# Patient Record
Sex: Female | Born: 1985 | Race: Black or African American | Hispanic: No | Marital: Single
Health system: Midwestern US, Community
[De-identification: ages and names within clinical notes are randomized; demographics above are authoritative.]

## PROBLEM LIST (undated history)

## (undated) MED ORDER — NAPROXEN 250 MG TAB
250 mg | ORAL_TABLET | Freq: Two times a day (BID) | ORAL | Status: DC
Start: ? — End: 2014-02-27

---
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
## 2011-11-16 MED ORDER — TRIMETHOPRIM-SULFAMETHOXAZOLE 160 MG-800 MG TAB
160-800 mg | ORAL_TABLET | Freq: Two times a day (BID) | ORAL | Status: DC
Start: 2011-11-16 — End: 2011-12-16

## 2011-11-16 MED ORDER — ONDANSETRON HCL 4 MG TAB
4 mg | ORAL_TABLET | Freq: Three times a day (TID) | ORAL | Status: DC | PRN
Start: 2011-11-16 — End: 2011-12-16

## 2011-11-16 MED ORDER — LIDOCAINE-EPINEPHRINE 1 %-1:100,000 IJ SOLN
1 %-:00,000 | Freq: Once | INTRAMUSCULAR | Status: AC
Start: 2011-11-16 — End: 2011-11-16
  Administered 2011-11-16: 19:00:00 via SUBCUTANEOUS

## 2011-11-16 MED ORDER — HYDROCODONE-ACETAMINOPHEN 5 MG-325 MG TAB
5-325 mg | ORAL_TABLET | ORAL | Status: DC | PRN
Start: 2011-11-16 — End: 2011-12-16

## 2011-11-16 NOTE — ED Notes (Signed)
Patient (s) was given copy of dc instructions and 3 script(s).  Patient (s)  verbalized understanding of instructions and script (s).  Patient given a current medication reconciliation form and verbalized understanding of their medications.   Patient (s) verbalized understanding of the importance of discussing medications with  his or her physician or clinic they will be following up with.  Patient alert and oriented and in no acute distress.  Patient discharged home ambulatory with self.

## 2011-11-16 NOTE — ED Notes (Signed)
Purulent, thick, bloody drainage noted during I & D. Area packed and covered with dry, sterile dressing by provider.

## 2011-11-16 NOTE — ED Provider Notes (Signed)
HPI Comments: Brandi Lucas is a 26 y.o. female who presents ambulatory to [facility] ED with cc of 10/10, painful large abscess to buttock x 1 week. Pt states that it hurts her to sit down. Pt notes that she has had abscesses in the past. Pt specifically denies F/C.    PCP: No primary provider on file.    PMhx is significant for: Abscess  SMhx is significant for: Pt denies any pertinent surgical hx  Social hx: + Smoke (1 ppd), + EtOH, - Drugs    There are no other complaints, changes or physical findings at this time.  Written by Melchor Amour, ED Scribe, as dictated by Marlan Palau, DO      The history is provided by the patient.        History reviewed. No pertinent past medical history.     History reviewed. No pertinent past surgical history.      History reviewed. No pertinent family history.     History     Social History   ??? Marital Status: N/A     Spouse Name: N/A     Number of Children: N/A   ??? Years of Education: N/A     Occupational History   ??? Not on file.     Social History Main Topics   ??? Smoking status: Current Everyday Smoker   ??? Smokeless tobacco: Not on file   ??? Alcohol Use: Yes   ??? Drug Use: No   ??? Sexually Active:      Other Topics Concern   ??? Not on file     Social History Narrative   ??? No narrative on file                  ALLERGIES: Review of patient's allergies indicates no known allergies.      Review of Systems   Constitutional: Negative.  Negative for fever and chills.   HENT: Negative.    Eyes: Negative.    Respiratory: Negative.    Cardiovascular: Negative.    Gastrointestinal: Negative.    Genitourinary: Negative.    Musculoskeletal: Negative.    Skin: Positive for wound (Abscess to buttocks).   Neurological: Negative.    All other systems reviewed and are negative.        Filed Vitals:    11/16/11 1455   BP: 125/70   Pulse: 110   Temp: 99.8 ??F (37.7 ??C)   Resp: 16   Height: 5\' 5"  (1.651 m)   Weight: 94.348 kg (208 lb)   SpO2: 99%            Physical Exam   Physical Examination: General  appearance - Well appearing in no apparent distress  Eyes - extraocular eye movements intact  Mouth - No apparent external lesions  Neck -  normal range of motion  Chest - Normal respiratory effort  Neurological - alert, oriented, normal speech, no apparent focal motor findings  Extremities - moves all extremities, no pedal edema  Skin - normal coloration, no apparent rashes, large area  Written by Melchor Amour, ED Scribe, as dictated by Marlan Palau, DO.    MDM     Differential Diagnosis; Clinical Impression; Plan:     DDx: Abscess vs Cellulitis. I&D procedure. Reassess post procedure.      Progress:   Patient progress:  Stable      Procedures    Procedure Note - Incision and Drainage:   3:31 PM  Performed  by: Marlan Palau, DO  Complexity: Simple  Skin prepped with Chlorprep.  Sterile field established.  Anesthesia achieved with 2 mLs of Lidocaine 1% with epinephrine using a local infiltration.   Abscess to buttocks was incised with # 11 blade, and copious of purulent foul smelling discharge was expressed.  Area was packed using 0.25 inch iodoform gauze.  Sterile dressing applied.    The procedure took 1-15 minutes, and pt tolerated well.  Written by Melchor Amour, ED Scribe, as dictated by Marlan Palau, DO.

## 2011-11-16 NOTE — ED Notes (Signed)
Dr. Izola Price at bedside to perform I & D. Nurse at bedside as well.

## 2011-11-22 NOTE — ED Provider Notes (Signed)
I have discussed this patient's care with the mid-level provider and I agree with the care provided and follow-up.

## 2011-11-22 NOTE — ED Provider Notes (Signed)
Patient is a 26 y.o. female presenting with abscess. The history is provided by the patient.   Abscess   This is a new problem. The problem has been gradually improving. There has been no fever. Affected Location: medial aspect of gluteal cleft. The patient is experiencing no pain. She has tried nothing (Pt can not afford antibiotics) for the symptoms. The treatment provided moderate relief.        History reviewed. No pertinent past medical history.     History reviewed. No pertinent past surgical history.      History reviewed. No pertinent family history.     History     Social History   ??? Marital Status: SINGLE     Spouse Name: N/A     Number of Children: N/A   ??? Years of Education: N/A     Occupational History   ??? Not on file.     Social History Main Topics   ??? Smoking status: Current Everyday Smoker   ??? Smokeless tobacco: Not on file   ??? Alcohol Use: Yes   ??? Drug Use: No   ??? Sexually Active:      Other Topics Concern   ??? Not on file     Social History Narrative   ??? No narrative on file                  ALLERGIES: Review of patient's allergies indicates no known allergies.      Review of Systems   Constitutional: Negative for fever and chills.   HENT: Negative for ear pain.    Respiratory: Negative for wheezing.    Cardiovascular: Negative for chest pain.   Gastrointestinal: Negative for abdominal pain.   Genitourinary: Negative for dysuria and pelvic pain.   Skin: Positive for wound.   Neurological: Negative for dizziness, weakness and numbness.   Hematological: Negative for adenopathy.   Psychiatric/Behavioral: Negative for agitation.   All other systems reviewed and are negative.        Filed Vitals:    11/22/11 1621   BP: 113/64   Pulse: 102   Temp: 97.9 ??F (36.6 ??C)   Resp: 16   Height: 5\' 5"  (1.651 m)   Weight: 94.348 kg (208 lb)   SpO2: 99%            Physical Exam   Nursing note and vitals reviewed.  Constitutional: She is oriented to person, place, and time. She appears well-developed and  well-nourished. No distress.   Cardiovascular: Normal rate, regular rhythm, normal heart sounds and intact distal pulses.    No murmur heard.  Pulmonary/Chest: Effort normal and breath sounds normal. She has no wheezes.   Abdominal: Soft. Bowel sounds are normal. There is no tenderness.   Neurological: She is alert and oriented to person, place, and time. No cranial nerve deficit.   Skin: Skin is warm. She is not diaphoretic.             Pt has a resolving abscess that was drained 6 days ago.  Pt has no signs of infection around the site.          MDM     Differential Diagnosis; Clinical Impression; Plan:     Abscess check      Procedures    Chief Complaint   Patient presents with   ??? Abscess     Told to FU for abcess recheck and packing removal.  MEDICATIONS GIVEN:  Medications - No data to display    LABS REVIEWED:  Labs Reviewed - No data to display    RADIOLOGY RESULTS:  The following have been ordered and reviewed:  _____________________________________________________________________    _____________________________________________________________________      PROCEDURES:        CONSULTATIONS:       PROGRESS NOTES:  5:40 PM  Spoke with patient about filling her Bactrim DS if she starts to notice increased pain, swelling or fever.  Pt understood and agreed. Theda Payer B Randalll, PA      IMPRESSION:  1- Abscess check    PLAN:  1- Pt will continue to Clean site and use warm compresses.  2- Pt will fill antibiotics if symptoms get worse.

## 2011-11-22 NOTE — ED Notes (Signed)
Reviewed discharge instructions with the patient..  The patient verbalized understanding.  Patient left with self via ambulation.

## 2011-11-22 NOTE — ED Notes (Signed)
Assumed care of patient.  Placed in position of comfort with call bell in reach.  Patient/family made aware of plan of care.  Patient comes in tonight for a wound check.  Patient was seen here on 11/16/11 and had an I&D performed to an abscess in the superior, medial aspect of the gluteal cleft. Patient denies fever, drainage, or pain.  Patient states packing fell out on it's own.  Patient states she has not gotten her antibiotic prescription filled yet.

## 2011-11-22 NOTE — ED Notes (Signed)
Sid Falcon, PA-C advised to call pt. And advise to get rx. Filled for antibiotics

## 2011-12-16 MED ORDER — TETANUS AND DIPHTHERIA TOX (PF) 5 LF UNIT-2 LF UNIT/0.5 ML IM SYRINGE
5-2 Lf unit/0.5 mL | Freq: Once | INTRAMUSCULAR | Status: AC
Start: 2011-12-16 — End: 2011-12-16
  Administered 2011-12-16: 20:00:00 via INTRAMUSCULAR

## 2011-12-16 MED ORDER — NEOMYCIN-BACITRACIN-POLYMYXIN TOP. PACKET
CUTANEOUS | Status: AC
Start: 2011-12-16 — End: 2011-12-16
  Administered 2011-12-16: 20:00:00 via TOPICAL

## 2011-12-16 MED ORDER — TRIMETHOPRIM-SULFAMETHOXAZOLE 160 MG-800 MG TAB
160-800 mg | ORAL_TABLET | Freq: Two times a day (BID) | ORAL | Status: AC
Start: 2011-12-16 — End: 2011-12-23

## 2011-12-16 MED ORDER — NAPROXEN 500 MG TAB
500 mg | ORAL_TABLET | Freq: Two times a day (BID) | ORAL | Status: AC
Start: 2011-12-16 — End: 2011-12-19

## 2011-12-16 MED FILL — DECAVAC (PF) 5 LF UNIT-2 LF UNIT/0.5 ML INTRAMUSCULAR SYRINGE: 5-2 Lf unit/0.5 mL | INTRAMUSCULAR | Qty: 0.5

## 2011-12-16 MED FILL — TRIPLE ANTIBIOTIC (BACITRACIN BASE) TOPICAL PACKET: CUTANEOUS | Qty: 1

## 2011-12-16 NOTE — ED Notes (Signed)
Patient (s)  given copy of dc instructions and 2 script(s).  Patient (s)  verbalized understanding of instructions and script (s).  Patient given a current medication reconciliation form and verbalized understanding of their medications.   Patient (s) verbalized understanding of the importance of discussing medications with  his or her physician or clinic they will be following up with.  Patient alert and oriented and in no acute distress.  Patient discharged home ambulatory with note for work and extra supplies.

## 2011-12-16 NOTE — ED Provider Notes (Signed)
I have personally seen and evaluated patient. I find the patient's history and physical exam are consistent with the PA's NP documentation. I agree with the care provided, treatments rendered, disposition and follow up plan.

## 2011-12-16 NOTE — ED Provider Notes (Signed)
Patient is a 26 y.o. female presenting with skin laceration. The history is provided by the patient. No language interpreter was used.   Laceration   The incident occurred 1 to 2 hours ago. The laceration is located on the left foot. The laceration is 1 cm in size. The injury mechanism is broken glass. Foreign body present: unknown. The pain is mild. Pertinent negatives include no numbness, no tingling, no weakness, no loss of motion and no discoloration. It is unknown when the patient last had a tetanus shot.        History reviewed. No pertinent past medical history.     History reviewed. No pertinent past surgical history.      History reviewed. No pertinent family history.     History     Social History   ??? Marital Status: SINGLE     Spouse Name: N/A     Number of Children: N/A   ??? Years of Education: N/A     Occupational History   ??? Not on file.     Social History Main Topics   ??? Smoking status: Current Everyday Smoker   ??? Smokeless tobacco: Not on file   ??? Alcohol Use: Yes   ??? Drug Use: No   ??? Sexually Active:      Other Topics Concern   ??? Not on file     Social History Narrative   ??? No narrative on file                  ALLERGIES: Review of patient's allergies indicates no known allergies.      Review of Systems   Skin: Positive for wound.        Patient has 2 small lacerations to bottom of L foot after stepping broken glass today.    Neurological: Negative for tingling, weakness and numbness.   All other systems reviewed and are negative.        Filed Vitals:    12/16/11 1406   BP: 116/68   Pulse: 91   Temp: 99.3 ??F (37.4 ??C)   Resp: 18   Height: 5\' 5"  (1.651 m)   Weight: 90.719 kg (200 lb)   SpO2: 98%            Physical Exam   Nursing note and vitals reviewed.  Constitutional: She is oriented to person, place, and time. She appears well-developed and well-nourished.   HENT:   Head: Normocephalic and atraumatic.   Right Ear: External ear normal.   Left Ear: External ear normal.   Mouth/Throat: Oropharynx is  clear and moist. No oropharyngeal exudate.   Eyes: Conjunctivae and EOM are normal. Pupils are equal, round, and reactive to light. Right eye exhibits no discharge. Left eye exhibits no discharge. No scleral icterus.   Neck: Normal range of motion. No tracheal deviation present. No thyromegaly present.   Cardiovascular: Normal rate, regular rhythm and normal heart sounds.    No murmur heard.  Pulmonary/Chest: Effort normal and breath sounds normal. No respiratory distress. She has no wheezes. She has no rales. She exhibits no tenderness.   Abdominal: Soft. Bowel sounds are normal. She exhibits no distension. There is no tenderness. There is no rebound and no guarding.   Musculoskeletal: Normal range of motion. She exhibits no edema and no tenderness.   Lymphadenopathy:     She has no cervical adenopathy.   Neurological: She is alert and oriented to person, place, and time. No cranial nerve deficit. Coordination normal.  Skin: Skin is warm. No erythema.        Patient has 1 cm superficial laceration plantar surface anterior heel medial side.  NO foreign body seen.  Opposite side of heel has 4 mm puncture wound, no foreign body seen.     Psychiatric: She has a normal mood and affect. Her behavior is normal. Judgment and thought content normal.        MDM    Procedures

## 2011-12-16 NOTE — ED Notes (Signed)
Patient states she was walking today and stepped on some glass and cut her left foot in 2 places on the bottom/posterior area of her foot i is 3 cm in legnth and the other is 0.5 cm.  She will not let me touch it in order to see if glass remaines in the foot or to see how deep the injury is

## 2013-01-18 MED ADMIN — naproxen (NAPROSYN) tablet 250 mg: ORAL | @ 14:00:00 | NDC 68462018801

## 2013-01-18 NOTE — ED Notes (Signed)
Patient (s)  given copy of dc instructions and one script(s).  Patient (s)  verbalized understanding of instructions and script (s).  Patient given a current medication reconciliation form and verbalized understanding of their medications.   Patient (s) verbalized understanding of the importance of discussing medications with  his or her physician or clinic they will be following up with.  Patient alert and oriented and in no acute distress.  Patient discharged home ambulatory with self.

## 2013-01-18 NOTE — ED Notes (Signed)
Left elbow pain since this AM. NKI

## 2013-01-18 NOTE — ED Provider Notes (Signed)
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 female presenting with elbow pain. The history is provided by the patient. No language interpreter was used.   Elbow Pain   This is a new problem. The current episode started 3 to 5 hours ago. The pain is present in the left elbow. The quality of the pain is described as aching. The pain is at a severity of 6/10. Associated symptoms include stiffness. Pertinent negatives include no numbness, no tingling, no itching, no back pain and no neck pain. There has been no history of extremity trauma.        No past medical history on file.     No past surgical history on file.      No family history on file.     History     Social History   ??? Marital Status: SINGLE     Spouse Name: N/A     Number of Children: N/A   ??? Years of Education: N/A     Occupational History   ??? Not on file.     Social History Main Topics   ??? Smoking status: Current Every Day Smoker   ??? Smokeless tobacco: Not on file   ??? Alcohol Use: Yes   ??? Drug Use: No   ??? Sexually Active:      Other Topics Concern   ??? Not on file     Social History Narrative   ??? No narrative on file                  ALLERGIES: Review of patient's allergies indicates no known allergies.      Review of Systems   Constitutional: Negative for fever.   HENT: Negative for neck pain.    Respiratory: Negative for shortness of breath.    Cardiovascular: Negative for chest pain.   Musculoskeletal: Positive for myalgias, arthralgias and stiffness. Negative for back pain.   Skin: Negative for itching, rash and wound.   Neurological: Negative for tingling, weakness and numbness.       Filed Vitals:    01/18/13 0909   Pulse: 91   Temp: 97.6 ??F (36.4 ??C)   Resp: 18   Height: 5\' 6"  (1.676 m)   Weight: 84.823 kg (187 lb)   SpO2: 100%            Physical Exam   Nursing note and vitals  reviewed.  Constitutional: She appears well-developed and well-nourished.   Pulmonary/Chest: Effort normal.   Musculoskeletal:        Left elbow: She exhibits decreased range of motion and effusion (small). She exhibits no deformity and no laceration. Tenderness found. Olecranon process tenderness noted.   Skin: Skin is warm and dry. No erythema.   Psychiatric: She has a normal mood and affect.        MDM     Differential Diagnosis; Clinical Impression; Plan:     Tendonitis, bursitis, djd    Plan - ace, ice, nsaids, pcp f/u    I have discussed with patient their diagnosis, treatment, and follow up plan. The patient agrees to follow up as outlined in discharge paperwork and also to return to the ED with any worsening. Bradd Burner, MD  Procedures

## 2014-02-27 MED ORDER — CYCLOBENZAPRINE 10 MG TAB
10 mg | ORAL_TABLET | Freq: Three times a day (TID) | ORAL | Status: AC | PRN
Start: 2014-02-27 — End: ?

## 2014-02-27 MED ORDER — NAPROXEN 250 MG TAB
250 mg | ORAL_TABLET | Freq: Two times a day (BID) | ORAL | Status: AC
Start: 2014-02-27 — End: ?

## 2014-02-27 NOTE — ED Notes (Signed)
Patient continues on phone at this time. Requested patient to hang up phone at this time so provider could examine her. Patient ended phone conversation at this time.

## 2014-02-27 NOTE — ED Notes (Signed)
Discharge Instructions Reviewed with patient per Alonda PA. Discharge instructions given to pateint per Alonda PA. Patient able to return verbalize discharge instructions. Paper copy of discharge instructions given. 2 RX given to patient per Alonda PA. Patient condition stable, Respiratory status WNL, Neurostatus intact. Ambulatory out of er, to home with self

## 2014-02-27 NOTE — ED Notes (Signed)
Discharge Instructions Reviewed with patient per Alonda PA. Discharge instructions given to patient per Alonda PA. Patient able to return verbalize discharge instructions. Paper copy of discharge instructions given. 2 RX given to patient per Alonda PA. Patient condition stable, Respiratory status WNL, Neurostatus intact. Ambualtory out of er, to home with self
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
## 2014-02-27 NOTE — ED Notes (Signed)
Patient on phone at this time. Provider into room to exam but patient continues to talk, ignoring provider at this time.

## 2019-04-21 ENCOUNTER — Other Ambulatory Visit: Payer: Self-pay

## 2019-04-21 ENCOUNTER — Encounter: Payer: Self-pay | Admitting: Emergency Medicine

## 2019-04-21 ENCOUNTER — Emergency Department
Admission: EM | Admit: 2019-04-21 | Discharge: 2019-04-21 | Discharge status: Against Medical Advice | Payer: Self-pay | Attending: Emergency Medicine | Admitting: Emergency Medicine

## 2019-04-21 DIAGNOSIS — L0291 Cutaneous abscess, unspecified: Secondary | ICD-10-CM

## 2019-04-21 DIAGNOSIS — N611 Abscess of the breast and nipple: Secondary | ICD-10-CM | POA: Insufficient documentation

## 2019-04-21 DIAGNOSIS — N644 Mastodynia: Secondary | ICD-10-CM

## 2019-04-21 DIAGNOSIS — Z5320 Procedure and treatment not carried out because of patient's decision for unspecified reasons: Secondary | ICD-10-CM | POA: Insufficient documentation

## 2019-04-21 MED ORDER — CEPHALEXIN 500 MG PO CAPS: 500.0000 mg | ORAL_CAPSULE | Freq: Four times a day (QID) | ORAL | 0 refills | Status: AC

## 2019-04-21 NOTE — Discharge Instructions (Signed)
I recommend that you wait in the emergency department for the ultrasound so we can decide whether there is an abscess that needs to be drained here in the emergency department.  This abscess could get bigger and make you extremely sick.  Please take antibiotics and return the emergency department for worsening of symptoms.  Follow-up with the [facility] breast center.

## 2019-04-21 NOTE — ED Triage Notes (Signed)
Pt reports has an abscess on her right breast and has been trying to pop it and drain it and now it is painful.

## 2019-04-21 NOTE — ED Provider Notes (Signed)
[facility] Emergency Department Provider Note  ____________________________________________  Time seen: Approximately 12:03 PM  I have reviewed the triage vital signs and the nursing notes.   HISTORY  Chief Complaint Abscess    HPI Wanda Simmons is a 33 y.o. female that presents to the emergency department with concerns of a right breast abscess.  Patient states that she has had a painful area to her right breast for over 1 year.  She was supposed to have surgery to have it removed but did not have insurance and never followed up.  Pain became worse this morning.  She did notice some nipple drainage.  No fevers.   History reviewed. No pertinent past medical history.  There are no active problems to display for this patient.   History reviewed. No pertinent surgical history.  Prior to Admission medications   Medication Sig Start Date End Date Taking? Authorizing Provider  cephALEXin (KEFLEX) 500 MG capsule Take 1 capsule (500 mg total) by mouth 4 (four) times daily for 10 days. 04/21/19 05/01/19  Laban Emperor, PA-C    Allergies Patient has no allergy information on record.  No family history on file.  Social History Social History   Tobacco Use  . Smoking status: Not on file  Substance Use Topics  . Alcohol use: Not on file  . Drug use: Not on file     Review of Systems  Constitutional: No fever/chills Respiratory: No SOB. Gastrointestinal: No abdominal pain.  No nausea, no vomiting.  Musculoskeletal: Negative for musculoskeletal pain. Skin: Negative for abrasions, lacerations, ecchymosis.  Positive for abscess.   ____________________________________________   PHYSICAL EXAM:  VITAL SIGNS: ED Triage Vitals  Enc Vitals Group     BP 04/21/19 1109 128/76     Pulse Rate 04/21/19 1109 94     Resp 04/21/19 1109 20     Temp 04/21/19 1109 98.6 F (37 C)     Temp Source 04/21/19 1109 Oral     SpO2 04/21/19 1109 98 %     Weight  04/21/19 1107 200 lb (90.7 kg)     Height 04/21/19 1107 5\' 6"  (1.676 m)     Head Circumference --      Peak Flow --      Pain Score 04/21/19 1107 10     Pain Loc --      Pain Edu? --      Excl. in [place]? --      Constitutional: Alert and oriented. Well appearing and in no acute distress. Eyes: Conjunctivae are normal. PERRL. EOMI. Head: Atraumatic. ENT:      Ears:      Nose: No congestion/rhinnorhea.      Mouth/Throat: Mucous membranes are moist.  Neck: No stridor. Cardiovascular: Normal rate, regular rhythm.  Good peripheral circulation. Respiratory: Normal respiratory effort without tachypnea or retractions. Lungs CTAB. Good air entry to the bases with no decreased or absent breath sounds. Breast: 1 by 2 cm area tof enderness to palpation and erythema next proximal 1 and 2 o'clock position above areola.  Some induration palpated but no swelling or fluctuance consistent with a drainable abscess.  No noted drainage. Musculoskeletal: Full range of motion to all extremities. No gross deformities appreciated. Neurologic:  Normal speech and language. No gross focal neurologic deficits are appreciated.  Skin:  Skin is warm, dry and intact. No rash noted. Psychiatric: Mood and affect are normal. Speech and behavior are normal. Patient exhibits appropriate insight and judgement.   ____________________________________________  LABS (all labs ordered are listed, but only abnormal results are displayed)  Labs Reviewed - No data to display ____________________________________________  EKG   ____________________________________________  RADIOLOGY   No results found.  ____________________________________________    PROCEDURES  Procedure(s) performed:    Procedures    Medications - No data to display   ____________________________________________   INITIAL IMPRESSION / ASSESSMENT AND PLAN / ED COURSE  Pertinent labs & imaging results that were available during my  care of the patient were reviewed by me and considered in my medical decision making (see chart for details).  Review of the [place] CSRS was performed in accordance of the [facility] prior to dispensing any controlled drugs.  Patient left the emergency department AGAINST MEDICAL ADVICE.  Patient presented to emergency department with concerns of a breast abscess.  Vital signs and exam are reassuring.  Exam is consistent with a breast abscess versus cellulitis.  I do not palpate a drainable  ultrasound was ordered to further evaluate.  Patient does not wish to wait any longer in the emergency department for ultrasound.  Patient elects to leave the emergency department AGAINST MEDICAL ADVICE.  Risks were discussed with the patient and she understands the risk of worsening or severe infection if there is an abscess that needs to be drained.  Patient will be discharged home with prescriptions for keflex. Patient is to follow up with breast center as directed. Contact info was given for the [facility]. Patient is given ED precautions to return to the ED for any worsening or new symptoms.   Wanda Simmons was evaluated in Emergency Department on 04/21/2019 for the symptoms described in the history of present illness. She was evaluated in the context of the global COVID-19 pandemic, which necessitated consideration that the patient might be at risk for infection with the SARS-CoV-2 virus that causes COVID-19. Institutional protocols and algorithms that pertain to the evaluation of patients at risk for COVID-19 are in a state of rapid change based on information released by regulatory bodies including the CDC and federal and state organizations. These policies and algorithms were followed during the patient's care in the ED.  ____________________________________________  FINAL CLINICAL IMPRESSION(S) / ED DIAGNOSES  Final diagnoses:  Abscess      NEW MEDICATIONS STARTED DURING THIS VISIT:  ED  Discharge Orders         Ordered    cephALEXin (KEFLEX) 500 MG capsule  4 times daily     04/21/19 1230              This chart was dictated using voice recognition software/Dragon. Despite best efforts to proofread, errors can occur which can change the meaning. Any change was purely unintentional.    Enid Derry, PA-C 04/21/19 1333    Emily Filbert, MD 04/21/19 [phone]

## 2019-04-21 NOTE — ED Notes (Signed)
See triage note  Presents with possible abscess area to right breast  States she tried to squeeze out infection  Denies any fever

## 2020-02-24 ENCOUNTER — Other Ambulatory Visit: Payer: Self-pay

## 2020-03-11 ENCOUNTER — Other Ambulatory Visit: Payer: Self-pay

## 2020-03-11 DIAGNOSIS — Z20822 Contact with and (suspected) exposure to covid-19: Secondary | ICD-10-CM

## 2020-03-12 LAB — SARS-COV-2, NAA 2 DAY TAT

## 2020-03-12 LAB — NOVEL CORONAVIRUS, NAA: SARS-CoV-2, NAA: NOT DETECTED

## 2020-03-16 ENCOUNTER — Ambulatory Visit: Payer: Self-pay | Attending: Internal Medicine

## 2020-03-16 DIAGNOSIS — Z23 Encounter for immunization: Secondary | ICD-10-CM

## 2020-03-16 NOTE — Progress Notes (Signed)
   Covid-19 Vaccination Clinic  Name:  Wanda Simmons    MRN: [id number] DOB: 08/05/1985  03/16/2020  [name] was observed post Covid-19 immunization for 15 minutes without incident. She was provided with Vaccine Information Sheet and instruction to access the V-Safe system.   [name] was instructed to call 911 with any severe reactions post vaccine: Marland Kitchen Difficulty breathing  . Swelling of face and throat  . A fast heartbeat  . A bad rash all over body  . Dizziness and weakness   Immunizations Administered    Name Date Dose VIS Date Route   Pfizer COVID-19 Vaccine 03/16/2020  2:57 PM 0.3 mL 07/30/2018 Intramuscular   Manufacturer: [employer], [employer]   Lot: [id number]   NDC: [address]-3

## 2020-04-06 ENCOUNTER — Ambulatory Visit: Payer: Self-pay | Attending: Internal Medicine

## 2020-04-06 DIAGNOSIS — Z23 Encounter for immunization: Secondary | ICD-10-CM

## 2020-04-06 NOTE — Progress Notes (Signed)
   Covid-19 Vaccination Clinic  Name:  Wanda Simmons    MRN: [id number] DOB: 03-08-1986  04/06/2020  [name] was observed post Covid-19 immunization for 15 minutes without incident. She was provided with Vaccine Information Sheet and instruction to access the V-Safe system.   [name] was instructed to call 911 with any severe reactions post vaccine: Marland Kitchen Difficulty breathing  . Swelling of face and throat  . A fast heartbeat  . A bad rash all over body  . Dizziness and weakness   Immunizations Administered    Name Date Dose VIS Date Route   Pfizer COVID-19 Vaccine 04/06/2020  2:47 PM 0.3 mL 03/24/2020 Intramuscular   Manufacturer: [employer], [employer]   Lot: [id number]   NDC: [address]-3
